# Patient Record
Sex: Male | Born: 1955 | Race: White | Hispanic: Refuse to answer
Health system: Western US, Academic
[De-identification: ages and names within clinical notes are randomized; demographics above are authoritative.]

## PROBLEM LIST (undated history)

## (undated) DIAGNOSIS — E119 Type 2 diabetes mellitus without complications: Secondary | ICD-10-CM

## (undated) DIAGNOSIS — E785 Hyperlipidemia, unspecified: Secondary | ICD-10-CM

## (undated) HISTORY — DX: Hyperlipidemia, unspecified: E78.5

## (undated) HISTORY — PX: SKIN CANCER EXCISION: SHX5110

## (undated) HISTORY — DX: Type 2 diabetes mellitus without complications: E11.9

## (undated) HISTORY — PX: PR APPENDECTOMY: 44950

---

## 2016-03-23 DIAGNOSIS — Z85828 Personal history of other malignant neoplasm of skin: Secondary | ICD-10-CM | POA: Insufficient documentation

## 2016-03-23 DIAGNOSIS — E119 Type 2 diabetes mellitus without complications: Secondary | ICD-10-CM | POA: Insufficient documentation

## 2018-03-07 ENCOUNTER — Encounter (INDEPENDENT_AMBULATORY_CARE_PROVIDER_SITE_OTHER): Payer: Self-pay | Admitting: Student in an Organized Health Care Education/Training Program

## 2018-03-07 ENCOUNTER — Ambulatory Visit (INDEPENDENT_AMBULATORY_CARE_PROVIDER_SITE_OTHER): Payer: PRIVATE HEALTH INSURANCE | Admitting: Student in an Organized Health Care Education/Training Program

## 2018-03-07 VITALS — BP 157/81 | HR 59 | Temp 97.0°F | Resp 14 | Ht 71.65 in | Wt 196.4 lb

## 2018-03-07 DIAGNOSIS — R030 Elevated blood-pressure reading, without diagnosis of hypertension: Secondary | ICD-10-CM

## 2018-03-07 DIAGNOSIS — Z6826 Body mass index (BMI) 26.0-26.9, adult: Secondary | ICD-10-CM

## 2018-03-07 DIAGNOSIS — E119 Type 2 diabetes mellitus without complications: Secondary | ICD-10-CM

## 2018-03-07 LAB — COMPREHENSIVE METABOLIC PANEL
ALT (GPT): 15 U/L (ref 10–48)
AST (GOT): 8 U/L — ABNORMAL LOW (ref 9–38)
Albumin: 4.2 g/dL (ref 3.5–5.2)
Alkaline Phosphatase (Total): 74 U/L (ref 37–159)
Anion Gap: 8 (ref 4–12)
Bilirubin (Total): 0.6 mg/dL (ref 0.2–1.3)
Calcium: 9.2 mg/dL (ref 8.9–10.2)
Carbon Dioxide, Total: 30 meq/L (ref 22–32)
Chloride: 102 meq/L (ref 98–108)
Creatinine: 0.93 mg/dL (ref 0.51–1.18)
GFR, Calc, African American: >60 mL/min/{1.73_m2} (ref >59)
GFR, Calc, European American: >60 mL/min/{1.73_m2} (ref >59)
Glucose: 187 mg/dL — ABNORMAL HIGH (ref 62–125)
Potassium: 4.1 meq/L (ref 3.6–5.2)
Protein (Total): 6.3 g/dL (ref 6.0–8.2)
Sodium: 140 meq/L (ref 135–145)
Urea Nitrogen: 14 mg/dL (ref 8–21)

## 2018-03-07 LAB — LIPID PANEL
Cholesterol (LDL): 128 mg/dL (ref <130)
Cholesterol/HDL Ratio: 4.5
HDL Cholesterol: 42 mg/dL (ref >39)
Non-HDL Cholesterol: 147 mg/dL (ref 0–159)
Total Cholesterol: 189 mg/dL (ref <200)
Triglyceride: 97 mg/dL (ref <150)

## 2018-03-07 LAB — PR A1C RAPID, ONSITE: Hemoglobin A1C: 7.4 % — ABNORMAL HIGH (ref 4.0–6.0)

## 2018-03-07 LAB — ALBUMIN/CREATININE RATIO, RANDOM URINE
Albumin (Micro), URN: 1.05 mg/dL
Albumin/Creatinine Ratio, URN: 7 mg/g{creat} (ref <30)
Creatinine/Unit, URN: 159 mg/dL

## 2018-03-07 NOTE — Result Encounter Note (Signed)
Sinus Bradycardia with incomplete RBBB.

## 2018-03-07 NOTE — Progress Notes (Signed)
[facility]    Marc Patel  [id number]  03/07/18    History of Present Complaint(s)  Marc Patel is a 62 year old male who comes to clinic today to establish care in the setting of diabetes.  The patient states he was originally here for a routine wellness exam however has not had any of his diabetic care recently and has ran out of his metformin which she has been taking infrequently over the past 6 months.  He states that he had a refill of this medication he just did not pick it up from his pharmacy.  He states that he does have some numbness and tingling in his toes.  He states that he has a diabetic eye exam scheduled for the next 1-2 weeks and gets those yearly.  He denies any current chest pain, palpitations, overt shortness of breath, wheezing, abdominal pain, foot ulcerations, urinary discharge, or dysuria.     Review of Systems  As stated above in HPI    All past medical, surgical, family, allergy, and medication histories were reviewed and pertinent aspects were updated in EPIC charting.      Physical Exam  BP (!) 157/81    Pulse 59    Temp 97 F (36.1 C) (Temporal)    Resp 14    Ht 5' 11.65" (1.82 m)    Wt 196 lb 6 oz (89.1 kg)    SpO2 97%    BMI 26.89 kg/m     Gen: no acute distress  HEENT: Atraumatic, Normocephalic, White sclera without icterus. Moist mucous membranes.  Pulm: Normal respiratory effort. Breath sounds clear to auscultation bilaterally.   CV: Normal rate. Regular rhythm. S1/S2. No murmurs, gallops, or rubs appreciated. Palpable distal radial/DP/PT pulses  Abd: Soft, non-tender, non-distended. +bowel sounds  EXT: No edema in the bilateral lower extremities  Neuro: Alert, conversant, appropriate.  Skin: Foot Exam reveals no ulcerations  Psych: Mood and affect appropriate    ECG 03/07/18   -Normal axis, Sinus bradycardia without acute ST-T wave changes.  Incomplete right bundle branch block    Assessment/Plan  Marc Patel is a 62 year old male presenting  to establish care in the setting of type 2 diabetes.  Initially, patient was here for a routine wellness exam however after talking to the patient he has not had routine care for his type 2 diabetes.    #Type 2 diabetes:   -Ordered ECG, CMP, lipid panel, microalbumin, monofilament, A1c and ECG.   -The patient states that he has not been taking his metformin routinely over the past 6 months.   -He states that he has a diabetic eye exam scheduled for the next 1-2 weeks  -Once his hemoglobin A1c is obtained we will talk about restarting metformin as the patient wants to know what his hemoglobin A1c is first before he starts taking metformin again.  -Patient would also likely benefit from initiation of a statin and blood pressure medication.  Risks discussed with the patient and specified under elevated blood pressure in the setting of diabetes diagnosis.  -Patient to follow-up in 2 weeks    #Elevated blood pressure in the setting of diabetes:   -Discussed that with the patient's elevated blood pressure starting a BP medication like lisinopril is indicated.  -Discussed risks of elevated blood pressure in the setting of diabetes which includes stroke, heart attack, and sudden death and the patient declined starting blood pressure medication at this time.  -The patient will  follow-up in 2 weeks for reassessment    The patient agreed with the above plan and was given an opportunity to ask questions before they left clinic.     Patient discussed with attending physician, Dr. Satira Sark who is in agreement with the assessment/plan as stated above.     Maylon Cos, DO  Primary Care Sports Medicine Fellow  Department of [facility]

## 2018-03-07 NOTE — Progress Notes (Signed)
DM2 on no meds or only intermittent for many months. Has not had DM follow up visits. Thus DM follow up not wellness. DM follow up. Some sensory deficits in feet. Pt declined BP meds. Will keep a log of BPs and will RTC in 2 weeks for follow up. Labs.

## 2018-03-07 NOTE — Progress Notes (Signed)
Reason for Visit:     Refills? NO  Referral? NO  Letter or Form? NO  Lab Results? NO    HEALTH MAINTENANCE:  Has the patient had this done since their last visit?  Cervical screening/PAP: N/A  Mammo: N/A  Colon Screen: N/A    Have you seen a specialist since your last visit: No    Vaccines Due? Yes,     HM Due:   Health Maintenance   Topic Date Due    Hepatitis C Screening  01-26-1956    Pneumococcal Vaccine: Pediatrics (0-5 years) and At-Risk Patients (6-64 years) (1 of 1 - PPSV23) 07/09/1960    Depression Screening (PHQ-2)  07/10/1966    HIV Screening  07/09/1969    Diabetes Foot Exam  07/09/1972    Diabetes Eye Exam  07/09/1972    Lipid Disorders Screening  07/09/1989    Colorectal Cancer Screening (FOBT/FIT)  07/09/2004    Diabetes A1c  05/02/2014    Zoster Vaccine (2 of 3) 10/15/2015    Tetanus Vaccine  05/13/2020    Influenza Vaccine  Completed       PCP Verified?  NO

## 2018-03-08 ENCOUNTER — Telehealth (INDEPENDENT_AMBULATORY_CARE_PROVIDER_SITE_OTHER): Payer: Self-pay | Admitting: Student in an Organized Health Care Education/Training Program

## 2018-03-08 LAB — EKG 12 LEAD
Atrial Rate: 57 {beats}/min
P Axis: 66 degrees
P-R Interval: 180 ms
Q-T Interval: 426 ms
QRS Duration: 86 ms
QTC Calculation: 414 ms
R Axis: 43 degrees
T Axis: 19 degrees
Ventricular Rate: 57 {beats}/min

## 2018-03-08 NOTE — Result Encounter Note (Signed)
A1c elevated. Would benefit from restarting medications. Will discuss with the patient at follow up visit in 2 weeks. Please make sure he schedules an appointment.

## 2018-03-08 NOTE — Telephone Encounter (Addendum)
LMTCB     Please see Dr. Caroleen Hamman note below:     A1c elevated. Would benefit from restarting medications. Will discuss with the patient at follow up visit in 2 weeks. Please make sure he schedules an appointment.    Pt scheduled to see Dr. Rogue Bussing on 12/9.     Routing to Dr. Scarlette Shorts as Juluis Rainier.

## 2018-03-08 NOTE — Result Encounter Note (Signed)
Done already

## 2018-03-09 NOTE — Telephone Encounter (Signed)
LMTCB 2nd attempt

## 2018-03-11 NOTE — Telephone Encounter (Signed)
LMTCB - 3rd attempt    Routing to Dr. Scarlette Shorts and Dr. Joline Maxcy as Juluis Rainier.

## 2018-03-11 NOTE — Progress Notes (Signed)
I have personally discussed the case with the resident during or immediately after the patient visit including review of history, physical exam, diagnosis, and treatment plan. I agree with the assessment and plan of care.

## 2018-03-21 ENCOUNTER — Encounter (INDEPENDENT_AMBULATORY_CARE_PROVIDER_SITE_OTHER): Payer: PRIVATE HEALTH INSURANCE | Admitting: Family Medicine

## 2018-03-28 ENCOUNTER — Ambulatory Visit (INDEPENDENT_AMBULATORY_CARE_PROVIDER_SITE_OTHER): Payer: PRIVATE HEALTH INSURANCE | Admitting: Family Medicine

## 2018-03-28 VITALS — BP 156/67 | HR 56 | Resp 12 | Wt 198.6 lb

## 2018-03-28 DIAGNOSIS — I10 Essential (primary) hypertension: Secondary | ICD-10-CM

## 2018-03-28 DIAGNOSIS — Z6827 Body mass index (BMI) 27.0-27.9, adult: Secondary | ICD-10-CM

## 2018-03-28 DIAGNOSIS — B9789 Other viral agents as the cause of diseases classified elsewhere: Secondary | ICD-10-CM

## 2018-03-28 DIAGNOSIS — L989 Disorder of the skin and subcutaneous tissue, unspecified: Secondary | ICD-10-CM

## 2018-03-28 DIAGNOSIS — J069 Acute upper respiratory infection, unspecified: Secondary | ICD-10-CM

## 2018-03-28 DIAGNOSIS — E119 Type 2 diabetes mellitus without complications: Secondary | ICD-10-CM

## 2018-03-28 DIAGNOSIS — E785 Hyperlipidemia, unspecified: Secondary | ICD-10-CM

## 2018-03-28 MED ORDER — LISINOPRIL 10 MG OR TABS
10.0000 mg | ORAL_TABLET | Freq: Every day | ORAL | 1 refills | Status: DC
Start: 2018-03-28 — End: 2018-10-03

## 2018-03-28 MED ORDER — METFORMIN HCL 500 MG OR TABS
500.0000 mg | ORAL_TABLET | Freq: Two times a day (BID) | ORAL | 2 refills | Status: DC
Start: 2018-03-28 — End: 2018-12-09

## 2018-03-28 MED ORDER — ATORVASTATIN CALCIUM 20 MG OR TABS
20.0000 mg | ORAL_TABLET | Freq: Every day | ORAL | 1 refills | Status: DC
Start: 2018-03-28 — End: 2022-07-13

## 2018-03-28 NOTE — Progress Notes (Signed)
FAMILY MEDICINE OUTPATIENT VISIT    An interpreter was not needed for the visit.   Type of Visit: established Problem Visit  Primary Care Provider:  No primary care provider on file.    Marc Patel is a 62 year old male patient, who presents to discuss the following:    [place]  Chief Complaint   Patient presents with   . Results   . Cough   . Derm Problem     SUBJECTIVE  1. Diabetes  -HbA1C 7.4% completed on 03/07/2018  -Last year, A1C was 5.9%; previously on metformin  -Would like to discuss results    2. Hypertension  -Blood pressure recently elevated  -Not on blood pressure medication now; apprehensive about starting medication    3. Hyperlipidemia  -Lipid Panel completed on 03/07/2018  -Currently not on statin for high cholesterol; previously prescribed simvastatin  -Would like to discuss results    4. Basal Cell Carcinoma/Lesion on Leg  -Two or three years ago, had lesion under left eye; identified as BCC; had MOHS surgery completed at that time; had appropriate follow-up  -Notes chronic, round lesion on left anterior leg  -Nothing has changed; color the same; not sure if it has increased in size  -Non-pruritic, no other similar lesions on body  -Had dermatologist look at 3-4 years ago; was told to keep eye on it  -Father had BCC and possible melanoma  -Previous sun exposure    5. Cough   -Productive cough; some congestion; no shortness of breath now  -No fevers  -Wife similar symptoms    Review of Systems  Constitutional: Negative for fevers, chills, sweats, weight loss  Respiratory: Positive for cough; negative for shortness of breath  Skin: see HPI    I personally reviewed and confirmed the medications, problem list, allergies and past medical history in the record with the patient today.    OBJECTIVE  Vitals:    03/28/18 1112 03/28/18 1127   BP: (!) 150/69 (!) 156/67   BP Cuff Size: Regular Regular   BP Site: Right Arm Left Arm   BP Position: Sitting Sitting   Pulse: 62 56   Resp: 12    SpO2: 95%       Weight: 198 lb 9.6 oz (90.1 kg)      Physical Exam  General:  Well nourished, well-developed.  Well dressed and groomed.  Alert and interactive.  Eyes: No scleral icterus, no conjunctival injection, no ocular discharge.  Lungs: Normal WOB. CTAB. No w/r/r.  Heart: RRR, normal S1/S2. No S3/S4.  No murmurs, rubs or gallops appreciated.    Skin: Left anterior leg is a round, well-circumscribed, erythematous plaque with regular borders and central scale - approximately 3-4cm circumference.  Psych:  Alert and oriented.  Recent and remote memory normal.  Mood and affect normal.  Judgement and insight appropriate.  Normal speech.  Thought pattern linear.  Well-groomed.  Maintains good eye contact.     ASSESSMENT AND PLAN  Marc Patel is a 62 year old male with the below diagnoses.    1. Type 2 diabetes mellitus without complication, without long-term current use of insulin  HbA1C completed on 03/07/2018 was 7.4%. Previously on metformin. Will restart today.   - metFORMIN 500 MG tablet; Take 1 tablet (500 mg) by mouth 2 times a day.  Dispense: 60 tablet; Refill: 2  - Risks, benefits, and side effects of medication as well as alternative therapies reviewed with patient.  - Follow-up in 3 months  2. Essential hypertension  BP 156/67. Previous BPs also elevated. Marc Patel is undecided about starting lisinopril but would like prescription in case he decides to start on his own.  - lisinopril 10 MG tablet; Take 1 tablet (10 mg) by mouth daily.  Dispense: 90 tablet; Refill: 1  - Risks, benefits, and side effects of medication as well as alternative therapies reviewed with patient.  - Follow-up in 3 months    3. Hyperlipidemia, unspecified hyperlipidemia type  Previously on simvastatin; not taking statin now. Lipid panel completed on 03/07/2018 showing:  The 10-year ASCVD risk score Marc Patel DC Brooke Bonito., et al., 2013) is: 33.1%    Values used to calculate the score:      Age: 42 years      Sex: Male      Is Non-Hispanic African  American: No      Diabetic: Yes      Tobacco smoker: No      Systolic Blood Pressure: 417 mmHg      Is BP treated: Yes      HDL Cholesterol: 42 mg/dL      Total Cholesterol: 189 mg/dL  Discussed importance of lipid control with statin medication. Will prescribe today; Marc Patel will make a decision about restarting at a future time.  - atorvastatin 20 MG tablet; Take 1 tablet (20 mg) by mouth daily. To lower Cholesterol.  Dispense: 90 tablet; Refill: 1  - Risks, benefits, and side effects of medication as well as alternative therapies reviewed with patient.  - Follow-up in 3 months    4. Skin lesion of left leg  Patient notes chronic, circular plaque on anterior left leg that has been present for years. Non-pruritic. Does not bother him. History of sun exposure. Previously evaluated by dermatology 3-4 years ago and advised continued monitoring. Patient notes no changes in lesion (no color changes, does not think it has grown). He has a personal history of BCC under left eye s/p MOHS. Also family history of BCC and possible melanoma in father. On exam, on left anterior leg is a circular, well-circumscribed, erythematous plaque with regular borders and some central scale - approximately 3-4cm circumference. Differential diagnosis includes psoriatic plaque, however, location for psoriasis on anterior leg seems somewhat unusual. With family history of possible melanoma, advised biopsy. Will also econsult dermatology for recommendations.    5. Viral URI with cough  Marc Patel presents with recent history of cough and congestion. Wife has similar symptoms. No fevers. Lungs clear on exam. Suspect viral URI. Follow-up if not improving within 5-7 days.     No follow-ups on file.    Marc Kells, MD  [place]  [place] [address]  [phone]    ----------------------------------------------------------  Patient's Medications   New Prescriptions    No medications on file    Previous Medications    ASPIRIN 325 MG TABLET    Take 81 mg by mouth.    METFORMIN 500 MG TABLET    Take 500 mg by mouth.   Modified Medications    No medications on file   Discontinued Medications    No medications on file

## 2018-03-28 NOTE — Progress Notes (Signed)
I saw and evaluated the patient. I have reviewed the resident's documentation and agree with it.

## 2018-03-31 DIAGNOSIS — L989 Disorder of the skin and subcutaneous tissue, unspecified: Secondary | ICD-10-CM | POA: Insufficient documentation

## 2018-03-31 DIAGNOSIS — Z808 Family history of malignant neoplasm of other organs or systems: Secondary | ICD-10-CM | POA: Insufficient documentation

## 2018-03-31 DIAGNOSIS — Z9889 Other specified postprocedural states: Secondary | ICD-10-CM | POA: Insufficient documentation

## 2018-03-31 DIAGNOSIS — I10 Essential (primary) hypertension: Secondary | ICD-10-CM | POA: Insufficient documentation

## 2018-03-31 DIAGNOSIS — E785 Hyperlipidemia, unspecified: Secondary | ICD-10-CM | POA: Insufficient documentation

## 2018-04-02 NOTE — Progress Notes (Signed)
------------------------------------------    Attending: Mylah Baynes Vre[place] Ruthel Martine  I agree with the findings and plan as documented in the resident's note.  ----------------------------------------

## 2018-04-04 ENCOUNTER — Other Ambulatory Visit (HOSPITAL_BASED_OUTPATIENT_CLINIC_OR_DEPARTMENT_OTHER): Payer: PRIVATE HEALTH INSURANCE | Admitting: Dermatology

## 2018-04-04 DIAGNOSIS — D485 Neoplasm of uncertain behavior of skin: Secondary | ICD-10-CM

## 2018-04-04 NOTE — Progress Notes (Signed)
Reason for eConsult:  Bobette Mo, * submitted the following request:  I am requesting an eConsult for this 62 year old male with a lesion of concern.  My clinical question is:    Patient is a 62 year old man with a history of diabetes, hypertension, hyperlipidemia, BCC s/p MOHS who presents with skin lesion. Previously a patient at [facility].  Patient notes chronic, circular plaque on anterior left leg that has been present for years. Non-pruritic. Does not bother him. He has not applied any creams or ointments to area. History of sun exposure. Per patient, the lesion was previously evaluated by dermatology 3-4 years ago; unsure about previous diagnosis; advised continued monitoring. Patient notes no changes in lesion (no color changes, does not think it has grown). He has a personal history of BCC under left eye s/p MOHS. Also family history of BCC and possible melanoma in father. On exam, on left anterior leg is a circular, well-circumscribed, erythematous plaque with regular borders and some central scale - approximately 3-4cm circumference. Differential diagnosis includes psoriatic plaque, however, location for psoriasis on anterior leg seems somewhat unusual to me vs BCC vs atypical melanoma. With family history of possible melanoma, advised biopsy. [facility] for recommendations.  If this clinical question is deemed too complex for eConsult, please:   schedule this patient for in-person consultation. This patient understands that they may receive a phone call from the specialty practice to schedule an appointment.  Larose Kells, MD  03/31/2018  _____________________________________________________________________  After careful review of the patient's results above and the patient's information available in the medical record,  the following are my findings and recommendations:  Lily Lovings, MD  04/04/2018    Date Images reviewed: 04/04/2018    Quality of Images:  Good Please use a patient label in all images. Please clearly mark each lesion of interest using a skin marking pen. Please include regional image with anatomical landmark.     1. Differential Diagnosis:   1. Squamous cell carcinoma in situ  2. Superficial basal cell carcinoma  3. Less likely porokeratosis    2. Recommendation(s):   4 mm punch biopsy at location identified and annotated image under local anesthesia.  Send specimen to pathology  If this cannot be performed in your clinic, please add addendum to this consultation and we will see him in dermatology clinic.    3. Rationale and/or evidence for recommendation: 2 images of the skin reveal a fairly well-demarcated pink red scaly plaque with focal areas of crusting and erosion    Review of chart: Patient last seen by dermatology at [facility] March 26, 2016 at which point they recommended topical 5 fluorouracil to the plaque on the left thigh.  Unclear patient used the 5 fluorouracil    October 2017, left nose, basal cell carcinoma    4. Contingency plan: If biopsy performed, please add addendum with pathology results    I favor squamous carcinoma in situ though superficial basal cell carcinoma is a strong consideration    I spent a total time of 7 minutes reviewing and communicating the above findings and recommendations.    "This eConsult is based solely on the clinical information available to me in the patient's medical record and is provided without benefit of a comprehensive evaluation or physical examination of the patient. The information contained in this eConsult must be interpreted in light of any clinical issues or changes in patient status that were not known to me at the  time this eConsult was completed. You must rely on your own informed clinical judgment for decision making. If necessary, we can schedule the patient for an in-office consultation."

## 2018-04-05 ENCOUNTER — Telehealth (HOSPITAL_BASED_OUTPATIENT_CLINIC_OR_DEPARTMENT_OTHER): Payer: Self-pay | Admitting: Family Medicine

## 2018-04-05 NOTE — Telephone Encounter (Signed)
Called patient with derm recommendations for a biopsy.    Ecare message also sent.

## 2018-09-07 ENCOUNTER — Other Ambulatory Visit (INDEPENDENT_AMBULATORY_CARE_PROVIDER_SITE_OTHER): Payer: Self-pay | Admitting: Family Medicine

## 2018-10-03 ENCOUNTER — Ambulatory Visit (INDEPENDENT_AMBULATORY_CARE_PROVIDER_SITE_OTHER): Payer: PRIVATE HEALTH INSURANCE | Admitting: Family Medicine

## 2018-10-03 ENCOUNTER — Other Ambulatory Visit
Admit: 2018-10-03 | Discharge: 2018-10-03 | Disposition: A | Discharge status: Home / Self Care | Payer: PRIVATE HEALTH INSURANCE | Attending: Family Medicine | Admitting: Family Medicine

## 2018-10-03 VITALS — BP 188/78 | HR 59

## 2018-10-03 DIAGNOSIS — E119 Type 2 diabetes mellitus without complications: Secondary | ICD-10-CM

## 2018-10-03 DIAGNOSIS — C44719 Basal cell carcinoma of skin of left lower limb, including hip: Secondary | ICD-10-CM | POA: Insufficient documentation

## 2018-10-03 DIAGNOSIS — I10 Essential (primary) hypertension: Secondary | ICD-10-CM

## 2018-10-03 DIAGNOSIS — D489 Neoplasm of uncertain behavior, unspecified: Secondary | ICD-10-CM

## 2018-10-03 MED ORDER — LISINOPRIL 20 MG OR TABS
20.0000 mg | ORAL_TABLET | Freq: Every day | ORAL | 0 refills | Status: DC
Start: 2018-10-03 — End: 2018-12-13

## 2018-10-03 NOTE — Progress Notes (Signed)
FAMILY MEDICINE OUTPATIENT VISIT    An interpreter was not needed for the visit.   Type of Visit: established Problem Visit  Primary Care Provider:  Lolly Mustache, MD    Marc Patel is a 63 year old male patient, who presents to discuss the following:    [place]  Chief Complaint   Patient presents with   . Procedure     biopsy       SUBJECTIVE  1. Procedure  Returns for biopsy on left upper leg (see note below) as recommended by dermatology; see Progress Note from 03/2018    2. Hypertension  Taking lisinopril 10mg  daily; no side effects    3. Diabetes  Taking 500mg  metformin twice daily; no side effects    Review of Systems  Constitutional: Negative for fevers and chills  Respiratory: Negative for cough and shortness of breath    I personally reviewed and confirmed the medications, problem list, allergies and past medical history in the record with the patient today.    OBJECTIVE  Vitals:    10/03/18 1326 10/03/18 1327   BP: (!) 167/91 (!) 188/78   BP Cuff Size: Large Large   BP Site: Left Arm Right Arm   BP Position: Sitting Sitting   Pulse:  59     Physical Exam  General:  Well nourished, well-developed.  Well dressed and groomed.  Alert and interactive.  Eyes: No scleral icterus, no conjunctival injection, no ocular discharge.  Lungs: Normal WOB.  Skin:  Left anterior leg is a round, well-circumscribed, erythematous plaque with regular borders and central scale - approximately 3-4cm circumference.  Psych:  Alert and oriented.  Recent and remote memory normal.  Mood and affect normal.  Judgement and insight appropriate.  Normal speech.  Thought pattern linear.  Well-groomed.  Maintains good eye contact.    ASSESSMENT AND PLAN  Marc Patel is a 63 year old male with the below diagnoses.    1. Neoplasm of uncertain behavior  Returning today for Procedure/Biopsy - see previous Derm econsult and Procedure Note below.  - PATHOLOGY, SURGICAL    2. Essential hypertension  BP 167/91 today. Recommend  increasing lisinopril from 10mg  to 20mg .  - lisinopril 20 MG tablet; Take 1 tablet (20 mg) by mouth daily.  Dispense: 90 tablet; Refill: 0  - Follow-up with new PCP Dr. Francisco Capuchin on 10/31/2018    3. Type 2 diabetes mellitus without complication, without long-term current use of insulin  Last HbA1C 7.4% 02/2018. Since it has been greater than 3 months, will obtain follow-up A1C today. Anticipate increasing metformin.  - A1C RAPID, ONSITE; Future    Punch Biopsy    Indication: Neoplasm of uncertain behavior  Site(s): Leg    Prep: Povidone iodine  Anesthesia: 1% lidocaine with epinephrine    Description of procedure:    The risk of scarring, bleeding, infection, pain, nerve damage and the benefit of obtaining a potential diagnosis were explained to the patient. The patient agreed to the procedure after being informed of the risks and benefits. Consent form signed.    Final verification was performed.  After prepping the skin and with anesthesia, a 32mm punch biopsy instrument was used to obtain the specimen. Specimen observed to be in the container correctly labeled with the patient's name and sent to the lab. Hemostasis was obtained using pressure dressing. Sterile dressing applied over topical antibiotic. The patient tolerated the procedure well. Patient was given wound care instructions.      No  follow-ups on file.    Larose Kells, MD  [facility] MEDICINE [facility]  [place] [address]  [phone]    ----------------------------------------------------------  Patient's Medications   New Prescriptions    LISINOPRIL 20 MG TABLET    Take 1 tablet (20 mg) by mouth daily.   Previous Medications    ASPIRIN 325 MG TABLET    Take 81 mg by mouth.    ATORVASTATIN 20 MG TABLET    Take 1 tablet (20 mg) by mouth daily. To lower Cholesterol.    METFORMIN 500 MG TABLET    Take 1 tablet (500 mg) by mouth 2 times a day.   Modified Medications    No medications on file   Discontinued Medications     LISINOPRIL 10 MG TABLET    Take 1 tablet (10 mg) by mouth daily.

## 2018-10-03 NOTE — Progress Notes (Signed)
I was present for the entire procedure: punch biopsy

## 2018-10-03 NOTE — Progress Notes (Signed)
Patient Rooming (in-clinic or Telemed): in clinic    Reason for Visit:   Chief Complaint   Patient presents with   . Procedure     biopsy         Refills? NO  Referral? NO  Letter or Form? NO  Lab Results? NO    HEALTH MAINTENANCE:  Has the patient had this done since their last visit?  Cervical screening/PAP: N/A  Mammo: N/A  Colon Screen: N/A    Have you seen a specialist since your last visit: No    Vaccines Due? Yes,     HM Due:   Health Maintenance   Topic Date Due   . Hepatitis C Screening  01/17/1956   . Pneumococcal Vaccine: Pediatrics (0-5 years) and At-Risk Patients (6-64 years) (1 of 1 - PPSV23) 07/09/1960   . HIV Screening  07/09/1969   . Diabetes Eye Exam  07/09/1972   . Hepatitis B Vaccine (1 of 3 - Risk 3-dose series) 07/09/1973   . Colorectal Cancer Screening  07/09/2004   . Zoster Vaccine (2 of 3) 10/15/2015   . Diabetes A1c  09/05/2018   . Depression Screening (PHQ-2)  03/08/2019   . Diabetes Foot Exam  03/08/2019   . Diabetes Nephropathy: Kidney Disease Monitoring  03/29/2019   . DTaP, Tdap, and Td Vaccines (2 - Td) 05/13/2020   . Lipid Disorders Screening  03/08/2023   . Influenza Vaccine  Completed       PCP Verified?  Yes, No primary care provider on file.

## 2018-10-05 LAB — PATHOLOGY, SURGICAL

## 2018-10-07 NOTE — Result Encounter Note (Signed)
See ecare to patient. Referring to dermatology for [facility].

## 2018-10-10 NOTE — Progress Notes (Signed)
Path results:    Skin, left upper leg, punch biopsy:   - Basal cell carcinoma, superficial multifocal type,   inked peripheral margin involved, inked deep margin   free in sections examined.    Recommendations:   - The patient may benefit from a dermatology visit. I will forward to the dermatology scheduling staff to schedule the patient within 6 weeks. An additional dermatology referral is not necessary.   - we can discuss treatment options for the superficial BCC, slow growing skin cancer

## 2018-10-14 NOTE — Progress Notes (Signed)
I have personally discussed the case with the resident during or immediately after the patient visit including review of history, physical exam, diagnosis, and treatment plan. I agree with the assessment and plan of care.     I was present for the entire procedure.

## 2018-10-24 ENCOUNTER — Encounter (INDEPENDENT_AMBULATORY_CARE_PROVIDER_SITE_OTHER): Payer: Self-pay | Admitting: Unknown Physician Specialty

## 2018-10-24 NOTE — Telephone Encounter (Signed)
Generic response sent.      Routing to PCP to please advise. -Thanks!

## 2018-10-31 ENCOUNTER — Encounter (INDEPENDENT_AMBULATORY_CARE_PROVIDER_SITE_OTHER): Payer: PRIVATE HEALTH INSURANCE | Admitting: Unknown Physician Specialty

## 2018-11-02 ENCOUNTER — Encounter (INDEPENDENT_AMBULATORY_CARE_PROVIDER_SITE_OTHER): Payer: Self-pay | Admitting: Unknown Physician Specialty

## 2018-12-01 ENCOUNTER — Telehealth (HOSPITAL_BASED_OUTPATIENT_CLINIC_OR_DEPARTMENT_OTHER): Payer: Self-pay

## 2018-12-01 NOTE — Telephone Encounter (Signed)
LVM to patient to call clinic to schedule. Please assist on scheduling when patient calls back. Thank you

## 2018-12-08 ENCOUNTER — Encounter (INDEPENDENT_AMBULATORY_CARE_PROVIDER_SITE_OTHER): Payer: Self-pay | Admitting: Unknown Physician Specialty

## 2018-12-08 DIAGNOSIS — I10 Essential (primary) hypertension: Secondary | ICD-10-CM

## 2018-12-08 DIAGNOSIS — E119 Type 2 diabetes mellitus without complications: Secondary | ICD-10-CM

## 2018-12-09 NOTE — Telephone Encounter (Signed)
Generic response sent.    Rx Refill Request: metFORMIN 500 MG tablet     Last Prescribed: 03/28/2018 (Dr. Rogue Bussing)    Last OV: 10/03/2018 (Dr. Rogue Bussing)  Upcoming OV: NA    Rx pended for provider review.     Waiting for patient to indicate which pharmacy he would like this sent to. All other Rx's have previously been printed.    Routing to PCP as Juluis Rainier. -Thanks!

## 2018-12-13 ENCOUNTER — Encounter (INDEPENDENT_AMBULATORY_CARE_PROVIDER_SITE_OTHER): Payer: Self-pay | Admitting: Unknown Physician Specialty

## 2018-12-13 MED ORDER — LISINOPRIL 20 MG OR TABS
20.0000 mg | ORAL_TABLET | Freq: Every day | ORAL | 2 refills | Status: DC
Start: 2018-12-13 — End: 2018-12-15

## 2018-12-13 MED ORDER — METFORMIN HCL 500 MG OR TABS
500.0000 mg | ORAL_TABLET | Freq: Two times a day (BID) | ORAL | 3 refills | Status: DC
Start: 2018-12-13 — End: 2018-12-15

## 2018-12-13 NOTE — Telephone Encounter (Signed)
eCare message sent to patient that printed Rx's are ready for pick-up.     Routing to PCP as Juluis Rainier. -Thanks!

## 2018-12-13 NOTE — Telephone Encounter (Signed)
I refilled lisinopril and metformin and printed them out. They're at the front desk. Marc Patel can come pick them up any time.

## 2018-12-13 NOTE — Telephone Encounter (Signed)
Generic response sent.    Routing to PCP as FYI. -Thanks!

## 2018-12-15 MED ORDER — LISINOPRIL 20 MG OR TABS
20.0000 mg | ORAL_TABLET | Freq: Every day | ORAL | 3 refills | Status: DC
Start: 2018-12-15 — End: 2020-04-01

## 2018-12-15 MED ORDER — METFORMIN HCL 500 MG OR TABS
1000.0000 mg | ORAL_TABLET | Freq: Two times a day (BID) | ORAL | 3 refills | Status: DC
Start: 2018-12-15 — End: 2020-04-01

## 2018-12-15 NOTE — Addendum Note (Signed)
Addended by: Francee Piccolo ADAM on: 12/15/2018 06:37 PM     Modules accepted: Orders

## 2018-12-16 NOTE — Telephone Encounter (Signed)
Picked up.

## 2019-01-29 ENCOUNTER — Other Ambulatory Visit: Payer: Self-pay

## 2019-08-05 ENCOUNTER — Ambulatory Visit (HOSPITAL_BASED_OUTPATIENT_CLINIC_OR_DEPARTMENT_OTHER): Payer: Self-pay

## 2019-08-05 DIAGNOSIS — Z23 Encounter for immunization: Secondary | ICD-10-CM

## 2019-08-22 ENCOUNTER — Ambulatory Visit (HOSPITAL_BASED_OUTPATIENT_CLINIC_OR_DEPARTMENT_OTHER): Payer: Self-pay

## 2019-08-25 ENCOUNTER — Ambulatory Visit (HOSPITAL_BASED_OUTPATIENT_CLINIC_OR_DEPARTMENT_OTHER): Payer: Self-pay

## 2019-08-26 ENCOUNTER — Ambulatory Visit (HOSPITAL_BASED_OUTPATIENT_CLINIC_OR_DEPARTMENT_OTHER): Payer: Self-pay

## 2019-08-30 ENCOUNTER — Ambulatory Visit (HOSPITAL_BASED_OUTPATIENT_CLINIC_OR_DEPARTMENT_OTHER): Payer: Self-pay

## 2019-08-30 DIAGNOSIS — Z23 Encounter for immunization: Secondary | ICD-10-CM

## 2019-12-28 ENCOUNTER — Encounter (INDEPENDENT_AMBULATORY_CARE_PROVIDER_SITE_OTHER): Payer: Self-pay

## 2020-02-01 ENCOUNTER — Telehealth (INDEPENDENT_AMBULATORY_CARE_PROVIDER_SITE_OTHER): Payer: Self-pay

## 2020-02-01 NOTE — Telephone Encounter (Signed)
Panel Management Meeting Notes    Provider: Lolly Mustache, MD   Attendees: PCP, [facility] Gaps Reviewed:    1. DM-eye  2. DM-kidney  3. A1C  4. Colon scrn    Action(s):    1. Patient to be scheduled for PCP appointment for a Wellness Visit      . Responsible Role: HN  . Date Expected by:      1st attempt - CCR, when patient calls back, please assist with scheduling an appointment for a AWV

## 2020-02-08 NOTE — Telephone Encounter (Signed)
2nd attempt - CCR, when patient calls back, please assist with scheduling an appointment for a AWV

## 2020-02-15 NOTE — Telephone Encounter (Signed)
3rd attempt - sent letter

## 2020-02-22 ENCOUNTER — Other Ambulatory Visit: Payer: Self-pay

## 2020-03-20 ENCOUNTER — Telehealth (INDEPENDENT_AMBULATORY_CARE_PROVIDER_SITE_OTHER): Payer: Self-pay

## 2020-03-20 NOTE — Telephone Encounter (Signed)
Panel Management Meeting Notes    Provider: Lolly Mustache, MD  Attendees: PCP, [facility] Gaps Reviewed:    1. DM-eye  2. DM-kidney  3. A1C  4. Colon scrn    Action(s):    1. Patient to be scheduled for PCP appointment for DM f/u   Responsible Role: HN   Date Expected by:        1st attempt

## 2020-03-27 NOTE — Telephone Encounter (Signed)
2nd attempt

## 2020-04-01 ENCOUNTER — Other Ambulatory Visit (INDEPENDENT_AMBULATORY_CARE_PROVIDER_SITE_OTHER): Payer: Self-pay | Admitting: Unknown Physician Specialty

## 2020-04-01 DIAGNOSIS — I10 Essential (primary) hypertension: Secondary | ICD-10-CM

## 2020-04-01 DIAGNOSIS — E119 Type 2 diabetes mellitus without complications: Secondary | ICD-10-CM

## 2020-04-01 NOTE — Telephone Encounter (Signed)
This request is outside of the Refill [facility]'s protocols.   Lisinopril, Metformin - last visit 10/03/2018 with Dr. Rogue Bussing; patient has not established care with current PCP     If this request is denied please have your staff inform the patient and schedule an appointment if necessary.

## 2020-04-02 MED ORDER — METFORMIN HCL 500 MG OR TABS
1000.0000 mg | ORAL_TABLET | Freq: Two times a day (BID) | ORAL | 3 refills | Status: DC
Start: 2020-04-02 — End: 2021-03-24

## 2020-04-02 MED ORDER — LISINOPRIL 20 MG OR TABS
20.0000 mg | ORAL_TABLET | Freq: Every day | ORAL | 3 refills | Status: DC
Start: 2020-04-02 — End: 2021-03-24

## 2020-04-03 NOTE — Telephone Encounter (Signed)
3rd attempt - sent letter

## 2020-05-28 ENCOUNTER — Encounter (INDEPENDENT_AMBULATORY_CARE_PROVIDER_SITE_OTHER): Payer: Self-pay

## 2020-05-29 ENCOUNTER — Telehealth (INDEPENDENT_AMBULATORY_CARE_PROVIDER_SITE_OTHER): Payer: Self-pay

## 2020-05-30 NOTE — Telephone Encounter (Signed)
Panel Management Meeting Notes    Provider: Lolly Mustache  Attendees: PCP, [facility] Gaps Reviewed:    1. A1C  2. DM-eye  3.   4.     Action(s):    1. Patient to be scheduled for PCP appointment for DM f/u   Responsible Role: HN   Date Expected by:      1st attempt - CCR, when patient calls back, please assist with scheduling a  PCP appointment for a DM f/u

## 2020-06-06 NOTE — Telephone Encounter (Signed)
2nd attempt sent message

## 2020-06-13 NOTE — Telephone Encounter (Signed)
3rd attempt - Sent Letter

## 2020-07-02 ENCOUNTER — Other Ambulatory Visit: Payer: Self-pay | Admitting: Gastroenterology

## 2020-07-02 DIAGNOSIS — Z1211 Encounter for screening for malignant neoplasm of colon: Secondary | ICD-10-CM

## 2020-09-03 ENCOUNTER — Encounter (INDEPENDENT_AMBULATORY_CARE_PROVIDER_SITE_OTHER): Payer: Self-pay

## 2020-10-01 ENCOUNTER — Encounter (INDEPENDENT_AMBULATORY_CARE_PROVIDER_SITE_OTHER): Payer: Self-pay

## 2020-12-10 ENCOUNTER — Encounter (INDEPENDENT_AMBULATORY_CARE_PROVIDER_SITE_OTHER): Payer: Self-pay

## 2021-02-03 ENCOUNTER — Other Ambulatory Visit: Payer: Self-pay

## 2021-02-04 ENCOUNTER — Encounter (INDEPENDENT_AMBULATORY_CARE_PROVIDER_SITE_OTHER): Payer: Self-pay

## 2021-02-27 ENCOUNTER — Other Ambulatory Visit: Payer: Self-pay

## 2021-03-17 ENCOUNTER — Encounter (INDEPENDENT_AMBULATORY_CARE_PROVIDER_SITE_OTHER): Payer: Self-pay

## 2021-03-24 ENCOUNTER — Ambulatory Visit (INDEPENDENT_AMBULATORY_CARE_PROVIDER_SITE_OTHER): Payer: PRIVATE HEALTH INSURANCE | Admitting: Family Medicine

## 2021-03-24 ENCOUNTER — Encounter (INDEPENDENT_AMBULATORY_CARE_PROVIDER_SITE_OTHER): Payer: Self-pay | Admitting: Family Medicine

## 2021-03-24 VITALS — BP 112/75 | HR 90 | Temp 98.5°F | Resp 16 | Wt 197.6 lb

## 2021-03-24 DIAGNOSIS — Z0000 Encounter for general adult medical examination without abnormal findings: Secondary | ICD-10-CM

## 2021-03-24 DIAGNOSIS — I10 Essential (primary) hypertension: Secondary | ICD-10-CM

## 2021-03-24 DIAGNOSIS — E119 Type 2 diabetes mellitus without complications: Secondary | ICD-10-CM

## 2021-03-24 DIAGNOSIS — Z1211 Encounter for screening for malignant neoplasm of colon: Secondary | ICD-10-CM

## 2021-03-24 DIAGNOSIS — Z23 Encounter for immunization: Secondary | ICD-10-CM

## 2021-03-24 LAB — A1C RAPID, ONSITE: Hemoglobin A1C: 7 % — ABNORMAL HIGH (ref 4.0–6.0)

## 2021-03-24 MED ORDER — LISINOPRIL 20 MG OR TABS
20.0000 mg | ORAL_TABLET | Freq: Every day | ORAL | 1 refills | Status: DC
Start: 2021-03-24 — End: 2021-12-15

## 2021-03-24 MED ORDER — INFLUENZA VAC A&B SA ADJ QUAD 0.5 ML IM PRSY
0.5000 mL | PREFILLED_SYRINGE | Freq: Once | INTRAMUSCULAR | Status: AC
Start: 2021-03-24 — End: 2021-03-24
  Administered 2021-03-24: 0.5 mL via INTRAMUSCULAR

## 2021-03-24 MED ORDER — METFORMIN HCL 500 MG OR TABS
1000.0000 mg | ORAL_TABLET | Freq: Two times a day (BID) | ORAL | 1 refills | Status: DC
Start: 2021-03-24 — End: 2021-12-15

## 2021-03-24 MED ORDER — COVID-19MRNA BIVAL VACC PFIZER 30 MCG/0.3ML IM SUSP
30.0000 ug | Freq: Once | INTRAMUSCULAR | Status: AC
Start: 2021-03-24 — End: 2021-03-24
  Administered 2021-03-24: 30 ug via INTRAMUSCULAR

## 2021-03-24 NOTE — Progress Notes (Signed)
Vaccine Screening Questions    Interpreter: No    1. Are you allergic to Latex? NO    2.  Have you had a serious reaction or an allergic reaction to a vaccine?  NO    3.  Currently have a moderate or severe illness, including fever?  NO    4.  Ever had a seizure or any neurological problem associated with a vaccine? (DTaP/TDaP/DTP pertinent) NO    5.  Is patient receiving any live vaccinations today? (Varicella-Chickenpox, MMR-Measles/Mumps/Rubella, Zoster-Shingles, Flumist, Yellow Fever) NOTE: oral rotavirus is exempt  NO    If YES to any of the questions above - Do NOT give vaccine.  Consult with RN or provider in clinic.  (#5 can be YES if all Live vaccine questions are answered NO)    If NO to all questions above - Patient may receive vaccine.    6. Are you pregnant or is there a chance you could become pregnant during the next month (HPV pertinent) NO    7. Do you need to receive the Flu vaccine today? YES - Additional Flu Questions  Flu Vaccine Screening Questions:    Ever had a serious allergic reaction to eggs?  NO    Ever had Guillain-Barre syndrome associated with a vaccine? NO    Less than 6 months old? NO    If YES to any of the Flu questions above - NO Flu Vaccine to be given.  Patient may consult provider as needed.    If NO to all questions above - Patient may receive Flu Shot (IM)    Is the patient requesting Flumist? NO    If between 6 months and 8 years of age, was flu vaccine received last year?  N/A  If NO to above question:  • Children who are receiving influenza vaccine for the first time - administer 2 doses of the current influenza vaccine (separated by at least 4 weeks).        All patients are encouraged to wait 15 minutes before leaving after receiving any vaccine.    VIS given 03/24/2021 by Czarina Gingras N Elhadji Pecore, CMA.

## 2021-03-24 NOTE — Progress Notes (Signed)
Patient Referred By: No ref. provider found  Patient's PCP: Graciella Freer, MD     Subjective:  Patient is a 65 year old male, here to discuss Wellness    The following portions of the patient's history were reviewed with the patient and updated as appropriate: problem list, current medications, allergies, past medical history, past surgical history, past social history and past family history.    HPI here for wellness exam    BSs have been in the 110 and below at home.     BPs not checked.  I asked him to check once or twice per month    Review of Systems   Constitutional: Negative for fever.   HENT: Negative for congestion.    Eyes: Negative for visual disturbance.   Respiratory: Negative for cough and shortness of breath.    Cardiovascular: Negative for chest pain.   Gastrointestinal: Negative for abdominal pain.   Genitourinary: Negative for difficulty urinating.   Musculoskeletal: Negative for back pain.   Neurological: Negative for dizziness.   Psychiatric/Behavioral: Negative for dysphoric mood.     Active Ambulatory Problems     Diagnosis Date Noted    History of basal cell cancer 03/23/2016    Other postprocedural states 03/31/2018    Type 2 diabetes mellitus without complication, without long-term current use of insulin ([place]) 03/23/2016    Essential hypertension 03/31/2018    Hyperlipidemia 03/31/2018    Skin lesion of left leg 03/31/2018    Family history of melanoma 03/31/2018     Resolved Ambulatory Problems     Diagnosis Date Noted    No Resolved Ambulatory Problems     Past Medical History:   Diagnosis Date    Diabetes mellitus ([place])     Lipidemia      Past Surgical History:   Procedure Laterality Date    PR APPENDECTOMY      SKIN CANCER EXCISION       Family History     Problem (# of Occurrences) Relation (Name,Age of Onset)    Dementia (2) Maternal Grandmother, Maternal Grandfather    Heart Attack (3) Mother, Father, Paternal Grandfather        Social History     Socioeconomic  History    Marital status: Not on file     Spouse name: Not on file    Number of children: Not on file    Years of education: Not on file    Highest education level: Not on file   Occupational History    Not on file   Tobacco Use    Smoking status: Never    Smokeless tobacco: Not on file   Substance and Sexual Activity    Alcohol use: Yes    Drug use: Never    Sexual activity: Not on file   Other Topics Concern    Not on file   Social History Narrative    Not on file     Social Determinants of Health     Financial Resource Strain: Not on file   Food Insecurity: Not on file   Transportation Needs: Not on file   Physical Activity: Not on file   Stress: Not on file   Social Connections: Not on file   Intimate Partner Violence: Not on file   Housing Stability: Not on file         Objective:  Physical Exam  Vitals and nursing note reviewed.   Constitutional:       Appearance: Normal appearance.  Cardiovascular:      Rate and Rhythm: Normal rate and regular rhythm.   Pulmonary:      Effort: Pulmonary effort is normal.      Breath sounds: Normal breath sounds.   Neurological:      Mental Status: He is alert and oriented to person, place, and time.          Assessment and Plan:   Diagnoses and all orders for this visit:    Routine general medical examination at a health care facility    Essential hypertension  -     lisinopril 20 MG tablet; Take 1 tablet (20 mg) by mouth daily.    Type 2 diabetes mellitus without complication, without long-term current use of insulin (HCC)  -     metFORMIN 500 MG tablet; Take 2 tablets (1,000 mg) by mouth 2 times a day.  -     POC Whole Blood A1C  -     Lipid Panel; Future  -     Comprehensive Metabolic Panel; Future  -     Albumin, Random Urine; Future    Needs flu shot  -     COVID-19 mRNA BIVALENT BOOSTER vaccine (Pfizer 12 yrs and older) injection 30 mcg  -     influenza quadrivalent adjuvanted vaccine (Fluad) injection 0.5 mL    Screen for colon cancer  -     OCCULT BLOOD  BY [facility], [facility]; Future    Other orders  -     ZEBRA LABELS    Follow-up in 3 months

## 2021-03-24 NOTE — Progress Notes (Signed)
COVID-19 Vaccine Intake Documentation      Pre-Vaccination Screening Questions:         1.  Are you feeling sick today?       NO       2. Have you ever received a dose of COVID-19 vaccine?     YES         If yes, which vaccine product?   Pfizer         3.  Have you  ever had a severe allergic reaction    (e.g., anaphylaxis) to something?  For example, a reaction for    which you were treated with epinephrine or Epi Pen  or    for which you had to go to the hospital?    . If yes, please review questions below:       NO       . Was the severe allergic reaction after receiving a COVID-19 vaccine?      NO     . Was the severe allergic reaction after receiving another vaccine or another injectable medication?     NO      4.  Does the patient weigh 66 pounds or less?   No   5. For pediatric patients, has your child ever been diagnosed with MIS-C (multisystem inflammatory disease) related to Covid-19?   No      . If so, have they recovered completely AND has it been more than 90 days since their diagnosis?   No   6. Does the patient attest they meet CDC eligibility criteria for this dose?   Yes

## 2021-03-25 LAB — COMPREHENSIVE METABOLIC PANEL
ALT (GPT): 13 U/L (ref 10–48)
AST (GOT): 17 U/L (ref 9–38)
Albumin: 4.2 g/dL (ref 3.5–5.2)
Alkaline Phosphatase (Total): 78 U/L (ref 36–161)
Anion Gap: 12 (ref 4–12)
Bilirubin (Total): 0.5 mg/dL (ref 0.2–1.3)
Calcium: 8.9 mg/dL (ref 8.9–10.2)
Carbon Dioxide, Total: 28 meq/L (ref 22–32)
Chloride: 99 meq/L (ref 98–108)
Creatinine: 1.09 mg/dL (ref 0.51–1.18)
Glucose: 115 mg/dL (ref 62–125)
Potassium: 4.6 meq/L (ref 3.6–5.2)
Protein (Total): 6.8 g/dL (ref 6.0–8.2)
Sodium: 139 meq/L (ref 135–145)
Urea Nitrogen: 15 mg/dL (ref 8–21)
eGFR by CKD-EPI 2021: >60 mL/min/{1.73_m2} (ref >59)

## 2021-03-25 LAB — LIPID PANEL
Cholesterol/HDL Ratio: 5
HDL Cholesterol: 33 mg/dL — ABNORMAL LOW (ref >39)
LDL Cholesterol, NIH Equation: 100 mg/dL (ref <130)
Non-HDL Cholesterol: 132 mg/dL (ref 0–159)
Total Cholesterol: 165 mg/dL (ref <200)
Triglyceride: 186 mg/dL — ABNORMAL HIGH (ref <150)

## 2021-03-25 LAB — ALBUMIN/CREATININE RATIO, RANDOM URINE
Albumin (Micro), URN: 1.57 mg/dL
Albumin/Creatinine Ratio, URN: 12 mg/g{creat} (ref <30)
Creatinine/Unit, URN: 127 mg/dL

## 2021-03-25 NOTE — Result Encounter Note (Signed)
See ecare

## 2021-04-24 ENCOUNTER — Other Ambulatory Visit (INDEPENDENT_AMBULATORY_CARE_PROVIDER_SITE_OTHER): Payer: Self-pay | Admitting: Family Medicine

## 2021-04-24 DIAGNOSIS — Z1211 Encounter for screening for malignant neoplasm of colon: Secondary | ICD-10-CM

## 2021-04-24 LAB — OCCULT BLOOD BY IA, STL: Occult Bld 1 Result: NEGATIVE

## 2021-04-24 NOTE — Result Encounter Note (Signed)
See ecare

## 2021-05-21 DEATH — deceased

## 2021-12-15 ENCOUNTER — Other Ambulatory Visit (INDEPENDENT_AMBULATORY_CARE_PROVIDER_SITE_OTHER): Payer: Self-pay | Admitting: Family Medicine

## 2021-12-15 DIAGNOSIS — E119 Type 2 diabetes mellitus without complications: Secondary | ICD-10-CM

## 2021-12-15 DIAGNOSIS — I10 Essential (primary) hypertension: Secondary | ICD-10-CM

## 2021-12-17 MED ORDER — LISINOPRIL 20 MG OR TABS
20.0000 mg | ORAL_TABLET | Freq: Every day | ORAL | 0 refills | Status: DC
Start: 2021-12-17 — End: 2022-04-21

## 2021-12-17 MED ORDER — METFORMIN HCL 500 MG OR TABS
1000.0000 mg | ORAL_TABLET | Freq: Two times a day (BID) | ORAL | 0 refills | Status: DC
Start: 2021-12-17 — End: 2022-04-21

## 2021-12-17 NOTE — Telephone Encounter (Signed)
Patient last seen on 03/24/21 and was to return in 3 months.  One refill authorized.  Please schedule follow up visit.

## 2021-12-23 NOTE — Telephone Encounter (Signed)
Message states the "wireless caller is not available" unable to leave message. 1st attempt

## 2021-12-24 NOTE — Telephone Encounter (Signed)
2nd attempt still unable to get through.    Closing

## 2022-04-02 ENCOUNTER — Encounter (INDEPENDENT_AMBULATORY_CARE_PROVIDER_SITE_OTHER): Payer: Self-pay | Admitting: Family Medicine

## 2022-04-03 NOTE — Telephone Encounter (Signed)
Pt wanting to be seen at [facility]    Please assist

## 2022-04-21 ENCOUNTER — Telehealth (INDEPENDENT_AMBULATORY_CARE_PROVIDER_SITE_OTHER): Payer: Self-pay | Admitting: Family Medicine

## 2022-04-21 ENCOUNTER — Other Ambulatory Visit (INDEPENDENT_AMBULATORY_CARE_PROVIDER_SITE_OTHER): Payer: Self-pay | Admitting: Family Medicine

## 2022-04-21 DIAGNOSIS — I10 Essential (primary) hypertension: Secondary | ICD-10-CM

## 2022-04-21 DIAGNOSIS — E119 Type 2 diabetes mellitus without complications: Secondary | ICD-10-CM

## 2022-04-21 MED ORDER — LISINOPRIL 20 MG OR TABS
20.0000 mg | ORAL_TABLET | Freq: Every day | ORAL | 0 refills | Status: DC
Start: 2022-04-21 — End: 2022-07-13

## 2022-04-21 MED ORDER — METFORMIN HCL 500 MG OR TABS
1000.0000 mg | ORAL_TABLET | Freq: Two times a day (BID) | ORAL | 0 refills | Status: DC
Start: 2022-04-21 — End: 2022-07-13

## 2022-04-21 NOTE — Telephone Encounter (Signed)
Please call patient and help patient with follow up appt asap with either me or [facility]. Electronically signed by Knox Saliva. Marcelline Mates, M.D. April 21, 2022 7:12 PM

## 2022-04-22 NOTE — Telephone Encounter (Signed)
Attempted to call pt, phone rang then went to dial tone    Postponing for 2nd attempt

## 2022-04-22 NOTE — Telephone Encounter (Signed)
Leaving for front desk to assist with scheduling

## 2022-04-23 NOTE — Telephone Encounter (Signed)
Called with the same difficulty as previous attempt. Ringing with dial tone; no voice message option available. Sent pt Mychart message asking to confirm phone number and schedule through scheduling ticket.     Postponing 24 hr for final attempt or pt response.

## 2022-04-24 NOTE — Telephone Encounter (Signed)
Called pt, issues with phone line. Sent text to pt via [employer] to schedule f/u with [facility] or [facility].     Multiple attempts made. Postponing for check if pt scheduled/close.

## 2022-05-06 ENCOUNTER — Ambulatory Visit (INDEPENDENT_AMBULATORY_CARE_PROVIDER_SITE_OTHER): Payer: PRIVATE HEALTH INSURANCE | Admitting: Internal Medicine

## 2022-05-06 ENCOUNTER — Ambulatory Visit (INDEPENDENT_AMBULATORY_CARE_PROVIDER_SITE_OTHER): Payer: No Typology Code available for payment source | Admitting: Internal Medicine

## 2022-05-18 ENCOUNTER — Encounter (INDEPENDENT_AMBULATORY_CARE_PROVIDER_SITE_OTHER): Payer: Self-pay | Admitting: Family Medicine

## 2022-05-19 NOTE — Telephone Encounter (Signed)
Don't see this appt. Please help patient get an in person appt. Electronically signed by Knox Saliva. Marcelline Mates, M.D. May 19, 2022 11:58 AM

## 2022-05-19 NOTE — Telephone Encounter (Signed)
Called pt, unable to LVM    [employer] and scheduling ticket     Postponing for 3rd attempt

## 2022-05-21 NOTE — Telephone Encounter (Signed)
Pt read message, closing TE

## 2022-06-19 ENCOUNTER — Ambulatory Visit (INDEPENDENT_AMBULATORY_CARE_PROVIDER_SITE_OTHER): Payer: No Typology Code available for payment source | Admitting: Family Medicine

## 2022-07-06 ENCOUNTER — Ambulatory Visit (INDEPENDENT_AMBULATORY_CARE_PROVIDER_SITE_OTHER): Payer: No Typology Code available for payment source | Admitting: Family Medicine

## 2022-07-13 ENCOUNTER — Encounter (INDEPENDENT_AMBULATORY_CARE_PROVIDER_SITE_OTHER): Payer: Self-pay | Admitting: Family Medicine

## 2022-07-13 ENCOUNTER — Ambulatory Visit (INDEPENDENT_AMBULATORY_CARE_PROVIDER_SITE_OTHER): Payer: No Typology Code available for payment source | Admitting: Family Medicine

## 2022-07-13 VITALS — BP 137/71 | HR 66 | Temp 98.3°F | Resp 18 | Ht 71.65 in | Wt 204.2 lb

## 2022-07-13 DIAGNOSIS — E785 Hyperlipidemia, unspecified: Secondary | ICD-10-CM

## 2022-07-13 DIAGNOSIS — Z23 Encounter for immunization: Secondary | ICD-10-CM

## 2022-07-13 DIAGNOSIS — Z0000 Encounter for general adult medical examination without abnormal findings: Secondary | ICD-10-CM

## 2022-07-13 DIAGNOSIS — Z1211 Encounter for screening for malignant neoplasm of colon: Secondary | ICD-10-CM

## 2022-07-13 DIAGNOSIS — I10 Essential (primary) hypertension: Secondary | ICD-10-CM

## 2022-07-13 DIAGNOSIS — E119 Type 2 diabetes mellitus without complications: Secondary | ICD-10-CM

## 2022-07-13 DIAGNOSIS — Z7984 Long term (current) use of oral hypoglycemic drugs: Secondary | ICD-10-CM

## 2022-07-13 LAB — COMPREHENSIVE METABOLIC PANEL
ALT (GPT): 12 U/L (ref 10–48)
AST (GOT): 14 U/L (ref 9–38)
Albumin: 4.1 g/dL (ref 3.5–5.2)
Alkaline Phosphatase (Total): 76 U/L (ref 36–161)
Anion Gap: 8 (ref 4–12)
Bilirubin (Total): 0.6 mg/dL (ref 0.2–1.3)
Calcium: 9.2 mg/dL (ref 8.9–10.2)
Carbon Dioxide, Total: 28 meq/L (ref 22–32)
Chloride: 101 meq/L (ref 98–108)
Creatinine: 1.06 mg/dL (ref 0.51–1.18)
Glucose: 176 mg/dL — ABNORMAL HIGH (ref 62–125)
Potassium: 4.4 meq/L (ref 3.6–5.2)
Protein (Total): 6.2 g/dL (ref 6.0–8.2)
Sodium: 137 meq/L (ref 135–145)
Urea Nitrogen: 19 mg/dL (ref 8–21)
eGFR by CKD-EPI 2021: >60 mL/min/{1.73_m2} (ref >59)

## 2022-07-13 LAB — LIPID PANEL
Cholesterol/HDL Ratio: 6.5
HDL Cholesterol: 35 mg/dL — ABNORMAL LOW (ref >39)
LDL Cholesterol, NIH Equation: 154 mg/dL — ABNORMAL HIGH (ref <130)
Non-HDL Cholesterol: 191 mg/dL — ABNORMAL HIGH (ref 0–159)
Total Cholesterol: 226 mg/dL — ABNORMAL HIGH (ref <200)
Triglyceride: 202 mg/dL — ABNORMAL HIGH (ref <150)

## 2022-07-13 LAB — A1C RAPID, ONSITE: Hemoglobin A1C: 8.4 % — ABNORMAL HIGH (ref 4.0–6.0)

## 2022-07-13 MED ORDER — ATORVASTATIN CALCIUM 20 MG OR TABS
20.0000 mg | ORAL_TABLET | Freq: Every day | ORAL | 3 refills | Status: DC
Start: 2022-07-13 — End: 2023-12-06

## 2022-07-13 MED ORDER — LISINOPRIL 20 MG OR TABS
20.0000 mg | ORAL_TABLET | Freq: Every day | ORAL | 3 refills | Status: DC
Start: 2022-07-13 — End: 2022-10-12

## 2022-07-13 MED ORDER — METFORMIN HCL 500 MG OR TABS
1000.0000 mg | ORAL_TABLET | Freq: Two times a day (BID) | ORAL | 3 refills | Status: DC
Start: 2022-07-13 — End: 2023-12-06

## 2022-07-13 MED ORDER — ZZCOVID-19 MRNA VACC (MODERNA) 50 MCG/0.5ML IM WRAPPER
50.0000 ug | Freq: Once | INTRAMUSCULAR | Status: AC
Start: 2022-07-13 — End: 2022-07-13
  Administered 2022-07-13: 50 ug via INTRAMUSCULAR

## 2022-07-13 MED ORDER — PNEUMOCOCCAL 20-VAL CONJ VACC 0.5 ML IM SUSY
0.5000 mL | PREFILLED_SYRINGE | Freq: Once | INTRAMUSCULAR | Status: AC
Start: 2022-07-13 — End: 2022-07-13
  Administered 2022-07-13: 0.5 mL via INTRAMUSCULAR

## 2022-07-13 NOTE — Progress Notes (Signed)
Patient Referred By: No ref. provider found  Patient's PCP: Pcp, Unknown     Subjective:  Patient is a 67 year old male, here to discuss Wellness (A1c)    The following portions of the patient's history were reviewed with the patient and updated as appropriate: problem list, current medications, allergies, past medical history, past surgical history, past social history, and past family history.    HPI Not checking BSs or BPs. Generally doing well with current meds.     Review of Systems   Constitutional:  Negative for fever.   HENT:  Negative for congestion.    Eyes:  Negative for visual disturbance.   Respiratory:  Negative for cough and shortness of breath.    Cardiovascular:  Negative for chest pain.   Gastrointestinal:  Negative for abdominal pain.   Genitourinary:  Negative for difficulty urinating.   Musculoskeletal:  Negative for back pain.   Neurological:  Negative for dizziness.   Psychiatric/Behavioral:  Negative for dysphoric mood.      Active Ambulatory Problems     Diagnosis Date Noted    History of basal cell cancer 03/23/2016    Other postprocedural states 03/31/2018    Type 2 diabetes mellitus without complication, without long-term current use of insulin (HCC) 03/23/2016    Essential hypertension 03/31/2018    Hyperlipidemia 03/31/2018    Skin lesion of left leg 03/31/2018    Family history of melanoma 03/31/2018     Resolved Ambulatory Problems     Diagnosis Date Noted    No Resolved Ambulatory Problems     Past Medical History:   Diagnosis Date    Diabetes mellitus (HCC)     Lipidemia      Past Surgical History:   Procedure Laterality Date    PR APPENDECTOMY      SKIN CANCER EXCISION       Family History       Problem (# of Occurrences) Relation (Name,Age of Onset)    Dementia (2) Maternal Grandmother, Maternal Grandfather    Heart Attack (3) Mother, Father, Paternal Grandfather          Social History     Socioeconomic History    Marital status: Married     Spouse name: Not on file    Number of  children: Not on file    Years of education: Not on file    Highest education level: Not on file   Occupational History    Not on file   Tobacco Use    Smoking status: Never    Smokeless tobacco: Not on file   Substance and Sexual Activity    Alcohol use: Not Currently     Comment: once a year    Drug use: Never    Sexual activity: Not on file   Other Topics Concern    Not on file   Social History Narrative    Not on file     Social Determinants of Health     Financial Resource Strain: Not on file   Food Insecurity: Not on file   Transportation Needs: Not on file   Physical Activity: Sufficiently Active (03/07/2018)    Exercise Vital Sign     Days of Exercise per Week: 6 days     Minutes of Exercise per Session: 30 min   Stress: Not on file   Social Connections: Not on file   Intimate Partner Violence: Not on file   Housing Stability: Not on file  Objective:  Physical Exam Temp: 36.8 C  Pulse: 66  BP: 137/71  Resp: 18  SpO2: 97 %  Weight: 92.6 kg (204 lb 3.2 oz)       Assessment and Plan:   Diagnoses and all orders for this visit:    Type 2 diabetes mellitus without complication, without long-term current use of insulin (HCC)  -     metFORMIN 500 MG tablet; Take 2 tablets (1,000 mg) by mouth 2 times a day.  -     POC Whole Blood A1C  -     Comprehensive Metabolic Panel; Future  -     Albumin/Creatinine Ratio, Random Urine; Future  -     Lipid Panel; Future  -     Comprehensive Metabolic Panel  -     Lipid Panel  -     Albumin/Creatinine Ratio, Random Urine    Essential hypertension  -     lisinopril 20 MG tablet; Take 1 tablet (20 mg) by mouth daily.    Hyperlipidemia, unspecified hyperlipidemia type  -     atorvastatin 20 MG tablet; Take 1 tablet (20 mg) by mouth daily. To lower Cholesterol.    Screen for colon cancer  -     Fecal Immunochemical Test (Colorectal Cancer Screen); Future    Other orders  -     aspirin 81 MG EC tablet; Take 1 tablet (81 mg) by mouth daily.  -     COVID-19 Moderna mRNA vaccine  (Spikevax) injection 50 mcg  -     pneumococcal 20-valent conjugate vaccine (Prevnar 20) injection 0.5 mL  -     ZEBRA LABELS     The 10-year ASCVD risk score (Arnett DK, et al., 2019) is: 38.5%    Values used to calculate the score:      Age: 29 years      Sex: Male      Is Non-Hispanic African American: No      Diabetic: Yes      Tobacco smoker: No      Systolic Blood Pressure: 137 mmHg      Is BP treated: Yes      HDL Cholesterol: 33 mg/dL      Total Cholesterol: 165 mg/dL

## 2022-07-13 NOTE — Progress Notes (Signed)
Vaccine Screening Questions    Interpreter: No    1. Are you allergic to Latex? NO    2.  Have you had a serious reaction or an allergic reaction to a vaccine?  NO    3.  Currently have a moderate or severe illness, including fever?  NO    4.  Ever had a seizure or any neurological problem associated with a vaccine? (DTaP/TDaP/DTP pertinent) NO    5.  Is patient receiving any live vaccinations today? (Varicella-Chickenpox, MMR-Measles/Mumps/Rubella, Zoster-Shingles, Flumist, Yellow Fever) NOTE: oral rotavirus is exempt  NO    If YES to any of the questions above - Do NOT give vaccine.  Consult with RN or provider in clinic.  (#5 can be YES if all Live vaccine questions are answered NO)    If NO to all questions above - Patient may receive vaccine.    6. Are you pregnant or is there a chance you could become pregnant during the next month (HPV pertinent) NO    7. Do you need to receive the Flu vaccine today? NO    All patients are encouraged to wait 15 minutes before leaving after receiving any vaccine.    VIS given 07/13/2022 by Tanna Savoy, CMA.

## 2022-07-14 ENCOUNTER — Other Ambulatory Visit (INDEPENDENT_AMBULATORY_CARE_PROVIDER_SITE_OTHER): Payer: Self-pay | Admitting: Family Medicine

## 2022-07-14 DIAGNOSIS — E119 Type 2 diabetes mellitus without complications: Secondary | ICD-10-CM

## 2022-07-14 LAB — ALBUMIN/CREATININE RATIO, RANDOM URINE
Albumin (Micro), URN: 1.26 mg/dL
Albumin/Creatinine Ratio, URN: 8 mg/g{creat} (ref <30)
Creatinine/Unit, URN: 155 mg/dL

## 2022-07-14 NOTE — Result Encounter Note (Signed)
See mychart msg

## 2022-07-17 ENCOUNTER — Telehealth (INDEPENDENT_AMBULATORY_CARE_PROVIDER_SITE_OTHER): Payer: Self-pay | Admitting: Family Medicine

## 2022-07-17 NOTE — Telephone Encounter (Signed)
Email not read. Please call patient. If not able to reach by phone, please send letter. Electronically signed by Mick Sell. Valentino Saxon, M.D. July 17, 2022 8:26 AM      Casimiro Needle-  1. Liver and kidney labs are normal.  2. HgA1C is 8.4 which means your average blood sugar is about 190 and diabetes is in poor control. I would suggest continuing the Metformin and adding a second medicine such as Ozempic. These medicines are injectables given either daily or weekly. They can also help with weight loss. Sometimes there's a back and forth with the insurance company to find one that is covered and available.  3. Cholesterol is high. I think you had been off your Atorvastatin. Please restart and recheck.  4. Check labs again in 3 months. I'll place the orders.  5. Please let me know what you think about these. There are significantly less expensive alternatives for the diabetes which are somewhat less effective.  Tom  Electronically signed by Mick Sell. Valentino Saxon, M.D. July 14, 2022 9:02 AM

## 2022-07-20 NOTE — Telephone Encounter (Signed)
1st attempt - LVM for Pt to call back     Leaving open for call back / 2nd attempt

## 2022-07-22 ENCOUNTER — Encounter (INDEPENDENT_AMBULATORY_CARE_PROVIDER_SITE_OTHER): Payer: Self-pay | Admitting: Family Medicine

## 2022-07-22 NOTE — Telephone Encounter (Signed)
2nd attempt. LVM to call us back.   Leaving open for call back and final attempt

## 2022-07-22 NOTE — Telephone Encounter (Addendum)
Insurance coverage updated and scanned into chart. Mychart message sent with next steps for pt to complete.     Guarantor note from 07/13/22 states pt did not have new insurance at the time of visit and would contact billing to update when insurance info was obtained. Pt was then given billing info.     Nothing Further Needed    Closing Encounter

## 2022-07-24 NOTE — Telephone Encounter (Signed)
Since we have not been able to reach the patient then please send letter.

## 2022-07-24 NOTE — Telephone Encounter (Signed)
Final attempt. Called pt and lvm.     There was no ringtone before reaching voice mail. Not sure if pt'w phone was on.     Routing to provider as fyi.

## 2022-07-27 NOTE — Telephone Encounter (Signed)
The letter generated and place in mail out pick up at [facility].     Routing to provider as Lorain Childes

## 2022-10-12 ENCOUNTER — Other Ambulatory Visit (INDEPENDENT_AMBULATORY_CARE_PROVIDER_SITE_OTHER): Payer: Self-pay | Admitting: Family Medicine

## 2022-10-12 DIAGNOSIS — I10 Essential (primary) hypertension: Secondary | ICD-10-CM

## 2022-10-12 NOTE — Telephone Encounter (Signed)
Rx was printed.

## 2022-10-14 MED ORDER — LISINOPRIL 20 MG OR TABS
20.0000 mg | ORAL_TABLET | Freq: Every day | ORAL | 2 refills | Status: DC
Start: 2022-10-14 — End: 2023-12-06

## 2023-04-02 ENCOUNTER — Ambulatory Visit (INDEPENDENT_AMBULATORY_CARE_PROVIDER_SITE_OTHER): Payer: No Typology Code available for payment source | Admitting: Family Medicine

## 2023-04-02 VITALS — BP 170/82 | HR 62 | Temp 98.6°F | Wt 195.3 lb

## 2023-04-02 DIAGNOSIS — H6123 Impacted cerumen, bilateral: Secondary | ICD-10-CM

## 2023-04-02 DIAGNOSIS — E119 Type 2 diabetes mellitus without complications: Secondary | ICD-10-CM

## 2023-04-02 DIAGNOSIS — Z23 Encounter for immunization: Secondary | ICD-10-CM

## 2023-04-02 DIAGNOSIS — Z7984 Long term (current) use of oral hypoglycemic drugs: Secondary | ICD-10-CM

## 2023-04-02 DIAGNOSIS — H9203 Otalgia, bilateral: Secondary | ICD-10-CM

## 2023-04-02 LAB — A1C RAPID, ONSITE: Hemoglobin A1C: 7.6 % — ABNORMAL HIGH (ref 4.0–6.0)

## 2023-04-02 MED ORDER — CARBAMIDE PEROXIDE 6.5 % OT SOLN
5.0000 [drp] | Freq: Once | OTIC | Status: AC
Start: 2023-04-02 — End: 2023-04-02
  Administered 2023-04-02: 5 [drp] via OTIC

## 2023-04-02 MED ORDER — COVID-19 MRNA VACC (MODERNA) 50 MCG/0.5ML IM SUSY
50.0000 ug | PREFILLED_SYRINGE | Freq: Once | INTRAMUSCULAR | Status: AC
Start: 2023-04-02 — End: 2023-04-02
  Administered 2023-04-02: 50 ug via INTRAMUSCULAR

## 2023-04-02 MED ORDER — INFLUENZA VAC SPLIT HIGH-DOSE 0.5 ML IM SUSY
0.5000 mL | PREFILLED_SYRINGE | Freq: Once | INTRAMUSCULAR | Status: AC
Start: 2023-04-02 — End: 2023-04-02
  Administered 2023-04-02: 0.5 mL via INTRAMUSCULAR

## 2023-04-02 NOTE — Progress Notes (Signed)
 Patient Referred By: No ref. provider found  Patient's PCP: Pcp, Unknown     Subjective:  Patient is a 67 year old male, here to discuss Lab Test (A1c ) and Immunization or Injection    The following portions of the patient's history were reviewed with the patient and updated as appropriate: problem list, current medications, allergies, past medical history, past surgical history, past social history, and past family history.    HPI generally doing well with current medications.  Not checking his blood sugars which is reasonable since he is only taking metformin for his blood sugars.  Blood pressures at home have been less than 140/90 when he is checking them.  He is having difficulty hearing out of both ears along with some discomfort in both ears, right more so than left.    Review of Systems   Constitutional:  Negative for fever.         Objective:  Physical Exam  Vitals and nursing note reviewed.   Constitutional:       Appearance: Normal appearance.   HENT:      Right Ear: There is impacted cerumen.      Left Ear: There is impacted cerumen.   Neurological:      Mental Status: He is alert and oriented to person, place, and time.      Temp: 37 C  Pulse: 62  BP: 170/82  SpO2: 98 %  Weight: 88.6 kg (195 lb 5.2 oz)       Assessment and Plan:   Diagnoses and all orders for this visit:    Acute ear pain, bilateral  -     Ear Cerumen Removal; Future  -     carbamide peroxide (Debrox) 6.5 % otic solution 5-10 drop    Type 2 diabetes mellitus without complication, without long-term current use of insulin (HCC)  -     POC Whole Blood A1C  -     Comprehensive Metabolic Panel; Future  -     Lipid Panel; Future    Other orders  -     influenza trivalent high-dose vaccine (Fluzone High-Dose) injection 0.5 mL  -     COVID-19 Moderna mRNA vaccine (Spikevax) injection 50 mcg    After use of high-pressure water and ear scoop ear canals are clear

## 2023-04-02 NOTE — Patient Instructions (Addendum)
At the pharmacy:  RSV vaccine  tDap (tetanus) vaccine  Shingles vaccine    At home:  Do the stool test!    Get your eyes checked!

## 2023-04-02 NOTE — Progress Notes (Signed)
 Vaccine Screening Questions    Interpreter: No    1. Are you allergic to Latex? NO    2.  Have you had a serious reaction or an allergic reaction to a vaccine?  NO    3.  Currently have a moderate or severe illness, including fever?  NO    4.  Ever had a seizure or any neurological problem associated with a vaccine? (DTaP/TDaP/DTP pertinent) NO    5.  Is patient receiving any live vaccinations today? (Varicella-Chickenpox, MMR-Measles/Mumps/Rubella, Zoster-Shingles, Flumist, Yellow Fever) NOTE: oral rotavirus is exempt  NO    If YES to any of the questions above - Do NOT give vaccine.  Consult with RN or provider in clinic.  (#5 can be YES if all Live vaccine questions are answered NO)    If NO to all questions above - Patient may receive vaccine.    6. Are you pregnant or is there a chance you could become pregnant during the next month (HPV pertinent) NO    7. Do you need to receive the Flu vaccine today? YES - Additional Flu Questions  Flu Vaccine Screening Questions:    Ever had Guillain-Barre syndrome associated with a vaccine? NO    Less than 6 months old? NO    If YES to any of the Flu questions above - NO Flu Vaccine to be given.  Patient may consult provider as needed.    If NO to all questions above - Patient may receive Flu Shot (IM)    Is the patient requesting Flumist? NO    If between 6 months and 29 years of age, was flu vaccine received last year?  N/A  If NO to above question:   Children who are receiving influenza vaccine for the first time - administer 2 doses of the current influenza vaccine (separated by at least 4 weeks).      All patients are encouraged to wait 15 minutes before leaving after receiving any vaccine.    VIS given 04/02/2023 by Tanna Savoy, CMA.

## 2023-04-03 LAB — COMPREHENSIVE METABOLIC PANEL
ALT (GPT): 14 U/L (ref 10–48)
AST (GOT): 15 U/L (ref 9–38)
Albumin: 4.4 g/dL (ref 3.5–5.2)
Alkaline Phosphatase (Total): 79 U/L (ref 36–161)
Anion Gap: 6 (ref 4–12)
Bilirubin (Total): 0.4 mg/dL (ref 0.2–1.3)
Calcium: 9.1 mg/dL (ref 8.9–10.2)
Carbon Dioxide, Total: 33 meq/L — ABNORMAL HIGH (ref 22–32)
Chloride: 99 meq/L (ref 98–108)
Creatinine: 1.19 mg/dL — ABNORMAL HIGH (ref 0.51–1.18)
Glucose: 138 mg/dL — ABNORMAL HIGH (ref 62–125)
Potassium: 4.7 meq/L (ref 3.6–5.2)
Protein (Total): 6.5 g/dL (ref 6.0–8.2)
Sodium: 138 meq/L (ref 135–145)
Urea Nitrogen: 15 mg/dL (ref 8–21)
eGFR by CKD-EPI 2021: >60 mL/min/{1.73_m2} (ref >59)

## 2023-04-03 LAB — LIPID PANEL
Cholesterol/HDL Ratio: 3.3
HDL Cholesterol: 40 mg/dL (ref >39)
LDL Cholesterol, NIH Equation: 68 mg/dL (ref <130)
Non-HDL Cholesterol: 90 mg/dL (ref 0–159)
Total Cholesterol: 130 mg/dL (ref <200)
Triglyceride: 119 mg/dL (ref <150)

## 2023-04-04 NOTE — Result Encounter Note (Signed)
See mychart msg

## 2023-12-06 ENCOUNTER — Other Ambulatory Visit (INDEPENDENT_AMBULATORY_CARE_PROVIDER_SITE_OTHER): Payer: Self-pay | Admitting: Family Medicine

## 2023-12-06 DIAGNOSIS — E119 Type 2 diabetes mellitus without complications: Secondary | ICD-10-CM

## 2023-12-06 DIAGNOSIS — I10 Essential (primary) hypertension: Secondary | ICD-10-CM

## 2023-12-06 DIAGNOSIS — E785 Hyperlipidemia, unspecified: Secondary | ICD-10-CM

## 2023-12-06 MED ORDER — LISINOPRIL 20 MG OR TABS
20.0000 mg | ORAL_TABLET | Freq: Every day | ORAL | 0 refills | Status: AC
Start: 2023-12-06 — End: ?

## 2023-12-06 MED ORDER — METFORMIN HCL 500 MG OR TABS
1000.0000 mg | ORAL_TABLET | Freq: Two times a day (BID) | ORAL | 0 refills | Status: AC
Start: 2023-12-06 — End: ?

## 2023-12-06 MED ORDER — ATORVASTATIN CALCIUM 20 MG OR TABS
20.0000 mg | ORAL_TABLET | Freq: Every day | ORAL | 0 refills | Status: AC
Start: 2023-12-06 — End: ?

## 2023-12-06 NOTE — Telephone Encounter (Signed)
 This request is outside of the Refill Center's protocols.    If this request is denied please have your staff inform the patient and schedule an appointment if necessary.
